# Patient Record
Sex: Male | Born: 1985 | Hispanic: Yes | Marital: Single | Smoking: Current some day smoker
Health system: Southern US, Community
[De-identification: ages and names within clinical notes are randomized; demographics above are authoritative.]

---

## 2016-06-10 ENCOUNTER — Emergency Department (HOSPITAL_COMMUNITY)
Admission: EM | Admit: 2016-06-10 | Discharge: 2016-06-10 | Disposition: A | Discharge status: Home / Self Care | Payer: Self-pay | Attending: Emergency Medicine | Admitting: Emergency Medicine

## 2016-06-10 ENCOUNTER — Encounter (HOSPITAL_COMMUNITY): Payer: Self-pay | Admitting: Emergency Medicine

## 2016-06-10 DIAGNOSIS — R234 Changes in skin texture: Secondary | ICD-10-CM | POA: Insufficient documentation

## 2016-06-10 DIAGNOSIS — R21 Rash and other nonspecific skin eruption: Secondary | ICD-10-CM

## 2016-06-10 DIAGNOSIS — L989 Disorder of the skin and subcutaneous tissue, unspecified: Secondary | ICD-10-CM | POA: Insufficient documentation

## 2016-06-10 DIAGNOSIS — M791 Myalgia: Secondary | ICD-10-CM | POA: Insufficient documentation

## 2016-06-10 DIAGNOSIS — F17210 Nicotine dependence, cigarettes, uncomplicated: Secondary | ICD-10-CM | POA: Insufficient documentation

## 2016-06-10 MED ORDER — DEXAMETHASONE 4 MG PO TABS: 4.0000 mg | ORAL_TABLET | Freq: Two times a day (BID) | ORAL | 0 refills | Status: AC

## 2016-06-10 MED ORDER — DIPHENHYDRAMINE HCL 25 MG PO TABS: 25.0000 mg | ORAL_TABLET | Freq: Four times a day (QID) | ORAL | 0 refills | Status: AC

## 2016-06-10 MED ORDER — PERMETHRIN 5 % EX CREA: TOPICAL_CREAM | CUTANEOUS | 1 refills | Status: AC

## 2016-06-10 MED ORDER — PREDNISONE 20 MG PO TABS
40.0000 mg | ORAL_TABLET | Freq: Once | ORAL | Status: AC
  Administered 2016-06-10: 40 mg via ORAL
  Filled 2016-06-10: qty 2

## 2016-06-10 MED ORDER — DOXYCYCLINE HYCLATE 100 MG PO TABS
100.0000 mg | ORAL_TABLET | Freq: Once | ORAL | Status: AC
  Administered 2016-06-10: 100 mg via ORAL
  Filled 2016-06-10: qty 1

## 2016-06-10 MED ORDER — LORATADINE 10 MG PO TABS
10.0000 mg | ORAL_TABLET | Freq: Once | ORAL | Status: AC
  Administered 2016-06-10: 10 mg via ORAL
  Filled 2016-06-10: qty 1

## 2016-06-10 MED ORDER — DOXYCYCLINE HYCLATE 100 MG PO CAPS: 100.0000 mg | ORAL_CAPSULE | Freq: Two times a day (BID) | ORAL | 0 refills | Status: AC

## 2016-06-10 NOTE — ED Triage Notes (Signed)
Pt reports rash on bilateral knees and entire body is starting to itch. Airway intact.

## 2016-06-10 NOTE — ED Provider Notes (Signed)
[facility]-EMERGENCY DEPT Provider Note   CSN: [id number] Arrival date & time: 06/10/16  1732  By signing my name below, I, Placido Sou, attest that this documentation has been prepared under the direction and in the presence of Ivery Quale, PA-C. Electronically Signed: Placido Sou, ED Scribe. 06/10/16. 6:36 PM.   History   Chief Complaint Chief Complaint  Patient presents with  . Rash    HPI HPI Comments: Lem Peary is a 30 y.o. male who presents to the Emergency Department complaining of worsening, mild, rash to his torso and extremities x 2 months. 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 His family whom he resides with has since began experiencing a similar rash. He additionally reports multiple healing scabs to his BLEs and originally had experienced mild swelling and drainage from the sites. No other associated symptoms at this time.   The history is provided by the patient. No language interpreter was used.  Rash   This is a new problem. The current episode started more than 1 week ago. The problem has been gradually worsening. There has been no fever. The rash is present on the left arm, right arm, left lower leg, right lower leg and abdomen. The pain is mild. Associated symptoms include itching and pain.    History reviewed. No pertinent past medical history.  There are no active problems to display for this patient.   History reviewed. No pertinent surgical history.   Home Medications    Prior to Admission medications   Not on File    Family History History reviewed. No pertinent family history.  Social History Social History  Substance Use Topics  . Smoking status: Current Every Day Smoker    Packs/day: 0.50    Types: Cigarettes  . Smokeless tobacco: Never Used  . Alcohol use No     Allergies   Review of  patient's allergies indicates no known allergies.   Review of Systems Review of Systems  Constitutional: Negative for fever.  Musculoskeletal: Positive for myalgias.  Skin: Positive for color change, itching, rash and wound.  All other systems reviewed and are negative.  Physical Exam Updated Vital Signs BP 132/77 (BP Location: Left Arm)   Pulse 90   Temp 99 F (37.2 C) (Oral)   Resp 18   Ht 5\' 6"  (1.676 m)   Wt 185 lb (83.9 kg)   SpO2 100%   BMI 29.86 kg/m   Physical Exam  Constitutional: He is oriented to person, place, and time. He appears well-developed and well-nourished.  HENT:  Head: Normocephalic and atraumatic.  Eyes: EOM are normal.  Neck: Normal range of motion.  Cardiovascular: Normal rate, regular rhythm, normal heart sounds and intact distal pulses.   Pulmonary/Chest: Effort normal and breath sounds normal. No respiratory distress.  Abdominal: Soft. He exhibits no distension. There is no tenderness.  Musculoskeletal: Normal range of motion.  Neurological: He is alert and oriented to person, place, and time.  Skin: Skin is warm and dry. Rash noted. There is erythema.  LLE has 2 scab wounds that are warm, raised, red and mildly tender. Resolved lesions on the RLE. Few raised bumps on the right lower arm. Lesions noted between the webbed spaces of the bilateral hands. Couple of red raised areas at the anterior belt line.   Psychiatric: He has a normal mood and affect. Judgment  normal.  Nursing note and vitals reviewed.   ED Treatments / Results  Labs (all labs ordered are listed, but only abnormal results are displayed) Labs Reviewed - No data to display  EKG  EKG Interpretation None       Radiology No results found.  Procedures Procedures  DIAGNOSTIC STUDIES: Oxygen Saturation is 100% on RA, normal by my interpretation.    COORDINATION OF CARE: 6:34 PM Discussed next steps with pt. Pt verbalized understanding and is agreeable with the plan.     Medications Ordered in ED Medications - No data to display   Initial Impression / Assessment and Plan / ED Course  I have reviewed the triage vital signs and the nursing notes.  Pertinent labs & imaging results that were available during my care of the patient were reviewed by me and considered in my medical decision making (see chart for details).  Clinical Course    *I have reviewed nursing notes, vital signs, and all appropriate lab and imaging results for this patient. ** **I personally performed the services described in this documentation, which was scribed in my presence. The recorded information has been reviewed and is accurate.* Final Clinical Impressions(s) / ED Diagnoses  Vital signs within normal limits. Pulse oximetry is 100% on room air. The examination favors a rash related to acute situation or anaphylaxis. Prescription for Decadron, Benadryl, doxycycline, and Elimite given to the patient. We discussed instructions. We also discussed the need to repeat the dosing of the Elimite if not improving in 5-7 days. Patient is in agreement with this discharge plan.    Final diagnoses:  Rash    New Prescriptions New Prescriptions   No medications on file     Ivery QualeHobson Ethyle Tiedt, PA-C 06/15/16 1345    Bethann BerkshireJoseph Zammit, MD 06/19/16 1513

## 2016-06-10 NOTE — Discharge Instructions (Addendum)
Please apply Elimite from head to toe. Leave it in place for 8 hours, then shower off. You may repeat this in one week if needed for your rash. Use Decadron 2 times daily. Use Benadryl every 6 hours as needed for itching. This medication may cause drowsiness. Do not drive, drink, or participate in activities requiring concentration when taking this medication. Use doxycycline 2 times daily until all taken, please take this medication with food.

## 2016-09-28 ENCOUNTER — Encounter (HOSPITAL_COMMUNITY): Payer: Self-pay | Admitting: Emergency Medicine

## 2016-09-28 ENCOUNTER — Emergency Department (HOSPITAL_COMMUNITY)
Admission: EM | Admit: 2016-09-28 | Discharge: 2016-09-28 | Disposition: A | Discharge status: Home / Self Care | Payer: Self-pay | Attending: Emergency Medicine | Admitting: Emergency Medicine

## 2016-09-28 DIAGNOSIS — Y999 Unspecified external cause status: Secondary | ICD-10-CM | POA: Insufficient documentation

## 2016-09-28 DIAGNOSIS — H7292 Unspecified perforation of tympanic membrane, left ear: Secondary | ICD-10-CM | POA: Insufficient documentation

## 2016-09-28 DIAGNOSIS — Y9389 Activity, other specified: Secondary | ICD-10-CM | POA: Insufficient documentation

## 2016-09-28 DIAGNOSIS — F17210 Nicotine dependence, cigarettes, uncomplicated: Secondary | ICD-10-CM | POA: Insufficient documentation

## 2016-09-28 DIAGNOSIS — Y929 Unspecified place or not applicable: Secondary | ICD-10-CM | POA: Insufficient documentation
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
## 2016-09-28 NOTE — Discharge Instructions (Signed)
You have been seen in the ED today with a ruptured eardrum. This will heal on its own. You will need to follow up with Dr. Annalee GentaShoemaker next week to decide if any surgery will be needed but most of these injuries heal on their own.   Return to the ED immediately with any fever, ear drainage, severe headache, or if you discover additional injuries.
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
## 2018-05-04 ENCOUNTER — Encounter (HOSPITAL_COMMUNITY): Payer: Self-pay | Admitting: Emergency Medicine

## 2018-05-04 ENCOUNTER — Other Ambulatory Visit: Payer: Self-pay

## 2018-05-04 ENCOUNTER — Emergency Department (HOSPITAL_COMMUNITY)
Admission: EM | Admit: 2018-05-04 | Discharge: 2018-05-04 | Disposition: A | Discharge status: Home / Self Care | Payer: Self-pay | Attending: Emergency Medicine | Admitting: Emergency Medicine

## 2018-05-04 ENCOUNTER — Emergency Department (HOSPITAL_COMMUNITY): Payer: Self-pay

## 2018-05-04 DIAGNOSIS — Y999 Unspecified external cause status: Secondary | ICD-10-CM | POA: Insufficient documentation

## 2018-05-04 DIAGNOSIS — Y939 Activity, unspecified: Secondary | ICD-10-CM | POA: Insufficient documentation

## 2018-05-04 DIAGNOSIS — X58XXXA Exposure to other specified factors, initial encounter: Secondary | ICD-10-CM | POA: Insufficient documentation

## 2018-05-04 DIAGNOSIS — Z79899 Other long term (current) drug therapy: Secondary | ICD-10-CM | POA: Insufficient documentation

## 2018-05-04 DIAGNOSIS — Z87891 Personal history of nicotine dependence: Secondary | ICD-10-CM | POA: Insufficient documentation

## 2018-05-04 DIAGNOSIS — Y929 Unspecified place or not applicable: Secondary | ICD-10-CM | POA: Insufficient documentation

## 2018-05-04 DIAGNOSIS — S46912A Strain of unspecified muscle, fascia and tendon at shoulder and upper arm level, left arm, initial encounter: Secondary | ICD-10-CM | POA: Insufficient documentation

## 2018-05-04 MED ORDER — IBUPROFEN 800 MG PO TABS
800.0000 mg | ORAL_TABLET | Freq: Once | ORAL | Status: AC
  Administered 2018-05-04: 800 mg via ORAL
  Filled 2018-05-04: qty 1

## 2018-05-04 MED ORDER — IBUPROFEN 800 MG PO TABS: 800.0000 mg | ORAL_TABLET | Freq: Three times a day (TID) | ORAL | 0 refills | Status: AC

## 2018-05-04 NOTE — ED Provider Notes (Signed)
[facility] EMERGENCY DEPARTMENT Provider Note   CSN: [id number] Arrival date & time: 05/04/18  1849     History   Chief Complaint Chief Complaint  Patient presents with  . Shoulder Pain    HPI Andrew Zimmerman is a 32 y.o. male.  The history is provided by the patient.  Shoulder Pain   This is a new problem. The current episode started 2 days ago. The problem occurs constantly. The problem has not changed since onset.The pain is present in the left shoulder. The quality of the pain is described as intermittent and aching. The pain is at a severity of 7/10. The pain is moderate. Pertinent negatives include no numbness, full range of motion, no stiffness and no tingling. Associated symptoms comments: Lying directly on the shoulder. He has tried nothing for the symptoms. There has been no history of extremity trauma (He denies direct injury but states he was running down a flight of steps when he started slipping so jumped from the 3rd to bottom. landed on feet but felt a pain in the left shoulder on impact.).    History reviewed. No pertinent past medical history.  There are no active problems to display for this patient.   History reviewed. No pertinent surgical history.      Home Medications    Prior to Admission medications   Medication Sig Start Date End Date Taking? Authorizing Provider  dexamethasone (DECADRON) 4 MG tablet Take 1 tablet (4 mg total) by mouth 2 (two) times daily with a meal. 06/10/16   Ivery Quale, PA-C  diphenhydrAMINE (BENADRYL) 25 MG tablet Take 1 tablet (25 mg total) by mouth every 6 (six) hours. 06/10/16   Ivery Quale, PA-C  doxycycline (VIBRAMYCIN) 100 MG capsule Take 1 capsule (100 mg total) by mouth 2 (two) times daily. 06/10/16   Ivery Quale, PA-C  ibuprofen (ADVIL,MOTRIN) 800 MG tablet Take 1 tablet (800 mg total) by mouth 3 (three) times daily. 05/04/18   Burgess Amor, PA-C  permethrin (ELIMITE) 5 % cream Apply cream from head to feet. Leave on  for 8 hours and shower off. May repeat in 1 week if needed. 06/10/16   Ivery Quale, PA-C    Family History No family history on file.  Social History Social History   Tobacco Use  . Smoking status: Former Smoker    Packs/day: 0.50    Types: Cigarettes  . Smokeless tobacco: Current User    Types: Snuff  Substance Use Topics  . Alcohol use: Yes  . Drug use: No     Allergies   Patient has no known allergies.   Review of Systems Review of Systems  Constitutional: Negative for fever.  Musculoskeletal: Positive for arthralgias. Negative for joint swelling, myalgias and stiffness.  Neurological: Negative for tingling, weakness and numbness.     Physical Exam Updated Vital Signs BP 129/72   Pulse 84   Temp 98.1 F (36.7 C)   Resp 18   Ht 5\' 8"  (1.727 m)   Wt 81.6 kg (180 lb)   SpO2 100%   BMI 27.37 kg/m   Physical Exam  Constitutional: He appears well-developed and well-nourished.  HENT:  Head: Atraumatic.  Neck: Normal range of motion.  Cardiovascular:  Pulses equal bilaterally  Musculoskeletal: He exhibits tenderness.       Left shoulder: He exhibits tenderness. He exhibits normal range of motion, no bony tenderness, no swelling, no effusion, no deformity, normal pulse and normal strength.  ttp left posterior shoulder joint space  and across the trapezius. No deformity, no crepitus of the shoulder joint with ROM.  There is no muscle spasm appreciated.  Neurological: He is alert. He has normal strength. He displays normal reflexes. No sensory deficit.  Skin: Skin is warm and dry.  Psychiatric: He has a normal mood and affect.  Nursing note and vitals reviewed.    ED Treatments / Results  Labs (all labs ordered are listed, but only abnormal results are displayed) Labs Reviewed - No data to display  EKG None  Radiology Dg Shoulder Left  Result Date: 05/04/2018 CLINICAL DATA:  Left shoulder pain times 19 hours. EXAM: LEFT SHOULDER - 2+ VIEW COMPARISON:   None. FINDINGS: There is no evidence of fracture or dislocation. There is no evidence of arthropathy or other focal bone abnormality. Soft tissues are unremarkable. IMPRESSION: Negative. Electronically Signed   By: Tollie Ethavid  Kwon M.D.   On: 05/04/2018 19:50    Procedures Procedures (including critical care time)  Medications Ordered in ED Medications  ibuprofen (ADVIL,MOTRIN) tablet 800 mg (800 mg Oral Given 05/04/18 2107)     Initial Impression / Assessment and Plan / ED Course  I have reviewed the triage vital signs and the nursing notes.  Pertinent labs & imaging results that were available during my care of the patient were reviewed by me and considered in my medical decision making (see chart for details).     Imaging reviewed and discussed with pt.  Rest, ice, ibuprofen, advised prn f/u with no improvement over the next week.   Final Clinical Impressions(s) / ED Diagnoses   Final diagnoses:  Strain of left shoulder, initial encounter    ED Discharge Orders        Ordered    ibuprofen (ADVIL,MOTRIN) 800 MG tablet  3 times daily     05/04/18 2058       Burgess Amordol, Erle Guster, PA-C 05/04/18 2217    Mesner, Barbara CowerJason, MD 05/04/18 [phone]

## 2018-05-04 NOTE — Discharge Instructions (Addendum)
I suspect he may have strained a muscle in your left shoulder.  Your x-rays and exam are normal tonight.  Use the ibuprofen as prescribed.  Additionally I recommend application of heat for 20 minutes several times daily.  You may also use your icy hot muscle rub but do not apply the heating pad on top of the muscle rub as this can cause skin irritation.  Expect gradual resolution of your pain over the next week, get rechecked if your symptoms do not improve with this treatment.

## 2018-05-04 NOTE — ED Triage Notes (Signed)
Pt c/o left shoulder pain since Saturday. Pt can raise his left arm.

## 2018-08-11 ENCOUNTER — Encounter (HOSPITAL_COMMUNITY): Payer: Self-pay | Admitting: Emergency Medicine

## 2018-08-11 ENCOUNTER — Other Ambulatory Visit: Payer: Self-pay

## 2018-08-11 ENCOUNTER — Emergency Department (HOSPITAL_COMMUNITY)
Admission: EM | Admit: 2018-08-11 | Discharge: 2018-08-11 | Disposition: A | Discharge status: Home / Self Care | Payer: Self-pay | Attending: Emergency Medicine | Admitting: Emergency Medicine

## 2018-08-11 DIAGNOSIS — H1031 Unspecified acute conjunctivitis, right eye: Secondary | ICD-10-CM | POA: Insufficient documentation

## 2018-08-11 DIAGNOSIS — F17210 Nicotine dependence, cigarettes, uncomplicated: Secondary | ICD-10-CM | POA: Insufficient documentation

## 2018-08-11 DIAGNOSIS — Z79899 Other long term (current) drug therapy: Secondary | ICD-10-CM | POA: Insufficient documentation

## 2018-08-11 MED ORDER — HYDROCODONE-ACETAMINOPHEN 5-325 MG PO TABS
2.0000 | ORAL_TABLET | Freq: Once | ORAL | Status: AC
  Administered 2018-08-11: 2 via ORAL
  Filled 2018-08-11: qty 2

## 2018-08-11 MED ORDER — TOBRAMYCIN 0.3 % OP SOLN
2.0000 [drp] | OPHTHALMIC | 0 refills | Status: AC
Start: 2018-08-11 — End: ?

## 2018-08-11 NOTE — Discharge Instructions (Signed)
Return if any problems.

## 2018-08-11 NOTE — ED Provider Notes (Signed)
[facility] EMERGENCY DEPARTMENT Provider Note   CSN: [id number] Arrival date & time: 08/11/18  1409     History   Chief Complaint Chief Complaint  Patient presents with  . Eye Problem    HPI Andrew Zimmerman is a 32 y.o. male.  The history is provided by the patient. No language interpreter was used.  Eye Problem   This is a new problem. The problem occurs constantly. The problem has been gradually worsening. There is a problem in the right eye. There was no injury mechanism. The pain is moderate. There is no history of trauma to the eye. There is no known exposure to pink eye. He does not wear contacts. Associated symptoms include decreased vision. He has tried nothing for the symptoms. The treatment provided no relief.  Pt complains of right eye redness. Pt reports thick drainage.   History reviewed. No pertinent past medical history.  There are no active problems to display for this patient.   History reviewed. No pertinent surgical history.      Home Medications    Prior to Admission medications   Medication Sig Start Date End Date Taking? Authorizing Provider  dexamethasone (DECADRON) 4 MG tablet Take 1 tablet (4 mg total) by mouth 2 (two) times daily with a meal. 06/10/16   Ivery Quale, PA-C  diphenhydrAMINE (BENADRYL) 25 MG tablet Take 1 tablet (25 mg total) by mouth every 6 (six) hours. 06/10/16   Ivery Quale, PA-C  doxycycline (VIBRAMYCIN) 100 MG capsule Take 1 capsule (100 mg total) by mouth 2 (two) times daily. 06/10/16   Ivery Quale, PA-C  ibuprofen (ADVIL,MOTRIN) 800 MG tablet Take 1 tablet (800 mg total) by mouth 3 (three) times daily. 05/04/18   Burgess Amor, PA-C  permethrin (ELIMITE) 5 % cream Apply cream from head to feet. Leave on for 8 hours and shower off. May repeat in 1 week if needed. 06/10/16   Ivery Quale, PA-C  tobramycin (TOBREX) 0.3 % ophthalmic solution Place 2 drops into the right eye every 4 (four) hours. 08/11/18   Elson Areas, PA-C     Family History History reviewed. No pertinent family history.  Social History Social History   Tobacco Use  . Smoking status: Current Some Day Smoker    Packs/day: 0.50    Types: Cigarettes  . Smokeless tobacco: Current User    Types: Snuff  Substance Use Topics  . Alcohol use: Yes    Comment: occasionally  . Drug use: No     Allergies   Patient has no known allergies.   Review of Systems Review of Systems  All other systems reviewed and are negative.    Physical Exam Updated Vital Signs BP 125/68 (BP Location: Right Arm)   Pulse 76   Temp 97.8 F (36.6 C) (Oral)   Resp 14   Ht 5\' 8"  (1.727 m)   Wt 77.1 kg   SpO2 99%   BMI 25.85 kg/m   Physical Exam  Constitutional: He appears well-developed and well-nourished.  HENT:  Head: Normocephalic and atraumatic.  Right Ear: External ear normal.  Left Ear: External ear normal.  Nose: Nose normal.  Mouth/Throat: Oropharynx is clear and moist.  Eyes: Conjunctivae are normal.  Neck: Neck supple.  Cardiovascular: Normal rate and regular rhythm.  No murmur heard. Pulmonary/Chest: Effort normal and breath sounds normal. No respiratory distress.  Abdominal: Soft. There is no tenderness.  Musculoskeletal: Normal range of motion. He exhibits no edema.  Neurological: He is alert.  Skin:  Skin is warm and dry.  Psychiatric: He has a normal mood and affect.  Nursing note and vitals reviewed.    ED Treatments / Results  Labs (all labs ordered are listed, but only abnormal results are displayed) Labs Reviewed - No data to display  EKG None  Radiology No results found.  Procedures Procedures (including critical care time)  Medications Ordered in ED Medications  HYDROcodone-acetaminophen (NORCO/VICODIN) 5-325 MG per tablet 2 tablet (2 tablets Oral Given 08/11/18 1603)     Initial Impression / Assessment and Plan / ED Course  I have reviewed the triage vital signs and the nursing notes.  Pertinent  labs & imaging results that were available during my care of the patient were reviewed by me and considered in my medical decision making (see chart for details).     Pt counseled on infection.  Pt given hydrocodone 2 here.  Rx for tobrex.   Final Clinical Impressions(s) / ED Diagnoses   Final diagnoses:  Acute conjunctivitis of right eye, unspecified acute conjunctivitis type    ED Discharge Orders         Ordered    tobramycin (TOBREX) 0.3 % ophthalmic solution  Every 4 hours     08/11/18 1601        An After Visit Summary was printed and given to the patient.    Elson AreasSofia, Janvi Ammar K, [place]PA-C 08/11/18 1617    Bethann BerkshireZammit, Joseph, MD 08/12/18 [phone]

## 2018-08-11 NOTE — ED Triage Notes (Signed)
Patient complaining of right eye burning, redness, and drainage x 3 days.

## 2018-08-15 ENCOUNTER — Encounter (HOSPITAL_COMMUNITY): Payer: Self-pay

## 2018-08-15 ENCOUNTER — Other Ambulatory Visit: Payer: Self-pay

## 2018-08-15 ENCOUNTER — Emergency Department (HOSPITAL_COMMUNITY)
Admission: EM | Admit: 2018-08-15 | Discharge: 2018-08-15 | Disposition: A | Discharge status: Home / Self Care | Payer: Self-pay | Attending: Emergency Medicine | Admitting: Emergency Medicine

## 2018-08-15 DIAGNOSIS — F17210 Nicotine dependence, cigarettes, uncomplicated: Secondary | ICD-10-CM | POA: Insufficient documentation

## 2018-08-15 DIAGNOSIS — H1031 Unspecified acute conjunctivitis, right eye: Secondary | ICD-10-CM

## 2018-08-15 DIAGNOSIS — H10021 Other mucopurulent conjunctivitis, right eye: Secondary | ICD-10-CM | POA: Insufficient documentation

## 2018-08-15 MED ORDER — FLUORESCEIN SODIUM 1 MG OP STRP
1.0000 | ORAL_STRIP | Freq: Once | OPHTHALMIC | Status: AC
  Administered 2018-08-15: 1 via OPHTHALMIC
  Filled 2018-08-15: qty 1

## 2018-08-15 MED ORDER — GENTAMICIN SULFATE 0.3 % OP SOLN: 2.0000 [drp] | OPHTHALMIC | 0 refills | Status: AC

## 2018-08-15 MED ORDER — TETRACAINE HCL 0.5 % OP SOLN
2.0000 [drp] | Freq: Once | OPHTHALMIC | Status: AC
  Administered 2018-08-15: 2 [drp] via OPHTHALMIC
  Filled 2018-08-15: qty 4

## 2018-08-15 MED ORDER — TOBRAMYCIN 0.3 % OP OINT
TOPICAL_OINTMENT | Freq: Once | OPHTHALMIC | Status: AC
  Administered 2018-08-15: 22:00:00 via OPHTHALMIC
  Filled 2018-08-15: qty 3.5

## 2018-08-15 NOTE — ED Triage Notes (Signed)
Pt here for re evaluation of right eye redness, itching and drainage x1 week. Pt was given eye drops without relief of symptoms.

## 2018-08-15 NOTE — Discharge Instructions (Signed)
Not wear your contacts until infection has completely cleared. Plan eyedrops every 4 hours while awake. Follow-up with eye doctor on Monday.

## 2018-08-15 NOTE — ED Provider Notes (Signed)
[facility] EMERGENCY DEPARTMENT Provider Note   CSN: [id number] Arrival date & time: 08/15/18  2110     History   Chief Complaint Chief Complaint  Patient presents with  . Eye Problem    HPI Andrew Zimmerman is a 32 y.o. male.  32 year old male presents with complaint of ongoing pinkeye affecting his right eye.  Patient wears contact lenses, was seen at [facility], ER 1 week ago and given tobramycin eyedrops to apply.  Patient has continued to wear his contacts and sleep and his contact lenses.  Reports green drainage from his eye.  Denies pain in the eye reports foreign body sensation.  No other complaints or concerns.     History reviewed. No pertinent past medical history.  There are no active problems to display for this patient.   History reviewed. No pertinent surgical history.      Home Medications    Prior to Admission medications   Medication Sig Start Date End Date Taking? Authorizing Provider  dexamethasone (DECADRON) 4 MG tablet Take 1 tablet (4 mg total) by mouth 2 (two) times daily with a meal. 06/10/16   Ivery Quale, PA-C  diphenhydrAMINE (BENADRYL) 25 MG tablet Take 1 tablet (25 mg total) by mouth every 6 (six) hours. 06/10/16   Ivery Quale, PA-C  doxycycline (VIBRAMYCIN) 100 MG capsule Take 1 capsule (100 mg total) by mouth 2 (two) times daily. 06/10/16   Ivery Quale, PA-C  gentamicin (GARAMYCIN) 0.3 % ophthalmic solution Place 2 drops into the right eye every 4 (four) hours. 08/15/18   Jeannie Fend, PA-C  ibuprofen (ADVIL,MOTRIN) 800 MG tablet Take 1 tablet (800 mg total) by mouth 3 (three) times daily. 05/04/18   Burgess Amor, PA-C  permethrin (ELIMITE) 5 % cream Apply cream from head to feet. Leave on for 8 hours and shower off. May repeat in 1 week if needed. 06/10/16   Ivery Quale, PA-C  tobramycin (TOBREX) 0.3 % ophthalmic solution Place 2 drops into the right eye every 4 (four) hours. 08/11/18   Elson Areas, PA-C     Family History No family history on file.  Social History Social History   Tobacco Use  . Smoking status: Current Some Day Smoker    Packs/day: 0.50    Types: Cigarettes  . Smokeless tobacco: Current User    Types: Snuff  Substance Use Topics  . Alcohol use: Yes    Comment: occasionally  . Drug use: No     Allergies   Patient has no known allergies.   Review of Systems Review of Systems  Constitutional: Negative for fever.  HENT: Negative for congestion and sore throat.   Eyes: Positive for discharge and redness. Negative for photophobia, pain and visual disturbance.  Skin: Negative for rash and wound.  Allergic/Immunologic: Negative for immunocompromised state.  Hematological: Negative for adenopathy. Does not bruise/bleed easily.  Psychiatric/Behavioral: Negative for confusion.  All other systems reviewed and are negative.    Physical Exam Updated Vital Signs BP 118/79 (BP Location: Right Arm)   Pulse 92   Temp 97.9 F (36.6 C)   Resp 18   Ht 5\' 8"  (1.727 m)   Wt 77.1 kg   SpO2 100%   BMI 25.84 kg/m   Physical Exam  Constitutional: He is oriented to person, place, and time. He appears well-developed and well-nourished. No distress.  HENT:  Head: Normocephalic and atraumatic.  Eyes: Pupils are equal, round, and reactive to light. Lids are normal. Right eye  exhibits no discharge. Right conjunctiva is injected. Right conjunctiva has no hemorrhage. Left conjunctiva is not injected. Left conjunctiva has no hemorrhage. Right eye exhibits normal extraocular motion. Left eye exhibits normal extraocular motion.  Slit lamp exam:      The right eye shows no corneal abrasion, no corneal flare, no corneal ulcer and no fluorescein uptake.  Right eye exam and fluorescein stain and Woods lamp, no fluorescein uptake, no streaming.  Pulmonary/Chest: Effort normal.  Neurological: He is alert and oriented to person, place, and time.  Skin: He is not diaphoretic.   Psychiatric: He has a normal mood and affect. His behavior is normal.  Nursing note and vitals reviewed.    ED Treatments / Results  Labs (all labs ordered are listed, but only abnormal results are displayed) Labs Reviewed - No data to display  EKG None  Radiology No results found.  Procedures Procedures (including critical care time)  Medications Ordered in ED Medications  fluorescein ophthalmic strip 1 strip (1 strip Right Eye Given 08/15/18 2139)  tetracaine (PONTOCAINE) 0.5 % ophthalmic solution 2 drop (2 drops Right Eye Given 08/15/18 2139)  tobramycin (TOBREX) 0.3 % ophthalmic ointment ( Right Eye Given 08/15/18 2210)     Initial Impression / Assessment and Plan / ED Course  I have reviewed the triage vital signs and the nursing notes.  Pertinent labs & imaging results that were available during my care of the patient were reviewed by me and considered in my medical decision making (see chart for details).  Clinical Course as of Aug 15 2224  Sat Aug 15, 2018  [id number] 32 year old male presents to the ER with complaint of ongoing pinkeye.  Patient has been applying eyedrops as prescribed by the ER 1 week ago.  Patient continues to wear his contact lenses and sleep and his contact lenses.  Patient denies pain in his eye.  Reports green drainage from his eye at times.  Patient was advised to remove his contact lens and throw it away, do not wear contact lenses again until infection is completely resolved.  On fluorescein exam patient does not have evidence of a corneal ulcer.  Given prescription for gentamicin drops, tobramycin applied to the eye while in the ER.  Advised to follow-up with ophthalmology on Monday.  Patient plans to see optometry tomorrow for glasses fitting.   [LM]    Clinical Course User Index [LM] Jeannie FendMurphy, Roxy Mastandrea A, PA-C   Final Clinical Impressions(s) / ED Diagnoses   Final diagnoses:  Acute bacterial conjunctivitis of right eye    ED Discharge Orders          Ordered    gentamicin (GARAMYCIN) 0.3 % ophthalmic solution  Every 4 hours     08/15/18 2205           Jeannie FendMurphy, Jesper Stirewalt A, PA-C 08/15/18 2225    Maia PlanLong, Joshua G, MD 08/16/18 [id number]

## 2018-11-27 ENCOUNTER — Emergency Department (HOSPITAL_COMMUNITY): Payer: Self-pay

## 2018-11-27 ENCOUNTER — Encounter (HOSPITAL_COMMUNITY): Payer: Self-pay

## 2018-11-27 ENCOUNTER — Emergency Department (HOSPITAL_COMMUNITY)
Admission: EM | Admit: 2018-11-27 | Discharge: 2018-11-27 | Disposition: A | Discharge status: Home / Self Care | Payer: Self-pay | Attending: Emergency Medicine | Admitting: Emergency Medicine

## 2018-11-27 ENCOUNTER — Other Ambulatory Visit: Payer: Self-pay

## 2018-11-27 DIAGNOSIS — Z23 Encounter for immunization: Secondary | ICD-10-CM | POA: Insufficient documentation

## 2018-11-27 DIAGNOSIS — S022XXA Fracture of nasal bones, initial encounter for closed fracture: Secondary | ICD-10-CM | POA: Insufficient documentation

## 2018-11-27 DIAGNOSIS — S0230XA Fracture of orbital floor, unspecified side, initial encounter for closed fracture: Secondary | ICD-10-CM | POA: Insufficient documentation

## 2018-11-27 DIAGNOSIS — Y939 Activity, unspecified: Secondary | ICD-10-CM | POA: Insufficient documentation

## 2018-11-27 DIAGNOSIS — Y999 Unspecified external cause status: Secondary | ICD-10-CM | POA: Insufficient documentation

## 2018-11-27 DIAGNOSIS — F17210 Nicotine dependence, cigarettes, uncomplicated: Secondary | ICD-10-CM | POA: Insufficient documentation

## 2018-11-27 DIAGNOSIS — Y929 Unspecified place or not applicable: Secondary | ICD-10-CM | POA: Insufficient documentation

## 2018-11-27 DIAGNOSIS — Z79899 Other long term (current) drug therapy: Secondary | ICD-10-CM | POA: Insufficient documentation

## 2018-11-27 MED ORDER — CEPHALEXIN 500 MG PO CAPS: 500.0000 mg | ORAL_CAPSULE | Freq: Four times a day (QID) | ORAL | 0 refills | Status: DC

## 2018-11-27 MED ORDER — TETANUS-DIPHTH-ACELL PERTUSSIS 5-2.5-18.5 LF-MCG/0.5 IM SUSP
0.5000 mL | Freq: Once | INTRAMUSCULAR | Status: AC
  Administered 2018-11-27: 0.5 mL via INTRAMUSCULAR
  Filled 2018-11-27: qty 0.5

## 2018-11-27 NOTE — ED Notes (Addendum)
Dermabond at bedside.  

## 2018-11-27 NOTE — ED Notes (Signed)
Patients wounds cleaned

## 2018-11-27 NOTE — ED Notes (Signed)
Patient sleeping at this time.
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
## 2018-11-27 NOTE — ED Notes (Signed)
Patient given oral fluids. Unable to ambulate patient at this time.
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
## 2018-11-27 NOTE — ED Notes (Signed)
Pt ambulated to and from bathroom without assistance, no complaints at this time

## 2018-11-27 NOTE — ED Notes (Signed)
Patient transported to CT 
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
## 2018-11-27 NOTE — ED Notes (Signed)
Patient to unsteady to ambulate at this time.

## 2018-11-27 NOTE — ED Notes (Signed)
Patient transported to X-ray 

## 2018-11-27 NOTE — ED Notes (Signed)
Patient awake at this time drinking fluids. Patient still a little disoriented. Advised patient to drink fluids and that we will ambulate him shortly.

## 2019-04-22 ENCOUNTER — Emergency Department (HOSPITAL_COMMUNITY)
Admission: EM | Admit: 2019-04-22 | Discharge: 2019-04-22 | Disposition: A | Discharge status: Home / Self Care | Payer: Self-pay | Attending: Emergency Medicine | Admitting: Emergency Medicine

## 2019-04-22 ENCOUNTER — Encounter (HOSPITAL_COMMUNITY): Payer: Self-pay | Admitting: Emergency Medicine

## 2019-04-22 ENCOUNTER — Other Ambulatory Visit: Payer: Self-pay

## 2019-04-22 ENCOUNTER — Emergency Department (HOSPITAL_COMMUNITY): Payer: Self-pay

## 2019-04-22 DIAGNOSIS — Y9289 Other specified places as the place of occurrence of the external cause: Secondary | ICD-10-CM | POA: Insufficient documentation

## 2019-04-22 DIAGNOSIS — S61112A Laceration without foreign body of left thumb with damage to nail, initial encounter: Secondary | ICD-10-CM | POA: Insufficient documentation

## 2019-04-22 DIAGNOSIS — Y9389 Activity, other specified: Secondary | ICD-10-CM | POA: Insufficient documentation

## 2019-04-22 DIAGNOSIS — W260XXA Contact with knife, initial encounter: Secondary | ICD-10-CM | POA: Insufficient documentation

## 2019-04-22 DIAGNOSIS — Y990 Civilian activity done for income or pay: Secondary | ICD-10-CM | POA: Insufficient documentation

## 2019-04-22 MED ORDER — CEPHALEXIN 250 MG PO CAPS
250.0000 mg | ORAL_CAPSULE | Freq: Once | ORAL | Status: AC
  Administered 2019-04-22: 250 mg via ORAL
  Filled 2019-04-22: qty 1

## 2019-04-22 MED ORDER — CEPHALEXIN 250 MG PO CAPS: 250.0000 mg | ORAL_CAPSULE | Freq: Four times a day (QID) | ORAL | 0 refills | Status: AC

## 2019-04-22 NOTE — ED Notes (Addendum)
Laceration to left thumb a couple of days ago with tile saw band. No bleeding at this time.

## 2019-04-22 NOTE — ED Provider Notes (Signed)
[facility]   CSN: [id number] Arrival date & time: 04/22/19  1927    History   Chief Complaint Chief Complaint  Patient presents with  . Finger Injury    HPI Andrew Zimmerman is a 33 y.o. male presenting for evaluation of thumb injury.  Patient states 5 days ago he was using a knife for work when it slipped and he cut his left thumb.  Patient states he is here today because he wanted to get it checked to make sure there would be repercussions or loss of function for his home.  He denies pain.  He has not been taking anything for it.  His tetanus is up-to-date.  He is not on blood thinners. He denies numbness or tingling.     HPI  History reviewed. No pertinent past medical history.  There are no active problems to display for this patient.   History reviewed. No pertinent surgical history.      Home Medications    Prior to Admission medications   Medication Sig Start Date End Date Taking? Authorizing Provider  cephALEXin (KEFLEX) 250 MG capsule Take 1 capsule (250 mg total) by mouth 4 (four) times daily for 5 days. 04/22/19 04/27/19  Latoy Labriola, PA-C  dexamethasone (DECADRON) 4 MG tablet Take 1 tablet (4 mg total) by mouth 2 (two) times daily with a meal. 06/10/16   Ivery QualeBryant, Hobson, PA-C  diphenhydrAMINE (BENADRYL) 25 MG tablet Take 1 tablet (25 mg total) by mouth every 6 (six) hours. 06/10/16   Ivery QualeBryant, Hobson, PA-C  doxycycline (VIBRAMYCIN) 100 MG capsule Take 1 capsule (100 mg total) by mouth 2 (two) times daily. 06/10/16   Ivery QualeBryant, Hobson, PA-C  gentamicin (GARAMYCIN) 0.3 % ophthalmic solution Place 2 drops into the right eye every 4 (four) hours. 08/15/18   Jeannie FendMurphy, Laura A, PA-C  ibuprofen (ADVIL,MOTRIN) 800 MG tablet Take 1 tablet (800 mg total) by mouth 3 (three) times daily. 05/04/18   Burgess AmorIdol, Julie, PA-C  permethrin (ELIMITE) 5 % cream Apply cream from head to feet. Leave on for 8 hours and shower off. May repeat in 1  week if needed. 06/10/16   Ivery QualeBryant, Hobson, PA-C  tobramycin (TOBREX) 0.3 % ophthalmic solution Place 2 drops into the right eye every 4 (four) hours. 08/11/18   Elson AreasSofia, Leslie K, PA-C    Family History History reviewed. No pertinent family history.  Social History Social History   Tobacco Use  . Smoking status: Current Some Day Smoker    Packs/day: 0.50    Types: Cigarettes  . Smokeless tobacco: Current User    Types: Snuff  Substance Use Topics  . Alcohol use: Yes    Comment: occasionally  . Drug use: No     Allergies   Patient has no known allergies.   Review of Systems Review of Systems  Skin: Positive for wound.  Hematological: Does not bruise/bleed easily.     Physical Exam Updated Vital Signs BP 139/71   Pulse 70   Temp 98.3 F (36.8 C) (Oral)   Resp 17   Ht 5\' 8"  (1.727 m)   Wt 79.4 kg   SpO2 100%   BMI 26.61 kg/m   Physical Exam Vitals signs and nursing note reviewed.  Constitutional:      General: He is not in acute distress.    Appearance: He is well-developed.     Comments: Appears nontoxic  HENT:     Head: Normocephalic and atraumatic.  Neck:  Musculoskeletal: Normal range of motion.  Pulmonary:     Effort: Pulmonary effort is normal.  Abdominal:     General: There is no distension.  Musculoskeletal: Normal range of motion.  Skin:    General: Skin is warm.     Capillary Refill: Capillary refill takes less than 2 seconds.     Findings: No rash.     Comments: See picture below.  Laceration of the radial aspect of the distal left thumb.  Small flap with nail attached.  No injury noted elsewhere in the thumb.  Full active range of motion of the thumb without difficulty.  Sensation intact.  No active bleeding.  No significant swelling, erythema, streaking, or signs of infection.  Neurological:     Mental Status: He is alert and oriented to person, place, and time.            ED Treatments / Results  Labs (all labs ordered are  listed, but only abnormal results are displayed) Labs Reviewed - No data to display  EKG None  Radiology Dg Finger Thumb Left  Result Date: 04/22/2019 CLINICAL DATA:  Injury. Distal left thumb laceration. Injury with knife. EXAM: LEFT THUMB 2+V COMPARISON:  None. FINDINGS: There is no evidence of fracture or dislocation. There is no evidence of arthropathy or other focal bone abnormality. Soft tissue injury through the distal digit/nail bed. Punctate density may represent tiny foreign body. IMPRESSION: Soft tissue injury to the distal digit bed. Punctate density may represent tiny foreign body. No osseous abnormality. Electronically Signed   By: Keith Rake M.D.   On: 04/22/2019 20:18    Procedures Procedures (including critical care time)  Medications Ordered in ED Medications  cephALEXin (KEFLEX) capsule 250 mg (250 mg Oral Given 04/22/19 2207)     Initial Impression / Assessment and Plan / ED Course  I have reviewed the triage vital signs and the nursing notes.  Pertinent labs & imaging results that were available during my care of the patient were reviewed by me and considered in my medical decision making (see chart for details).        Patient presenting for evaluation of finger injury.  Physical examination, he is neurovascularly intact.  Tetanus is up-to-date.  As injury is 5 days out, will not repair with sutures due to risk for infection.  X-ray viewed interpreted by me, no bony abnormality.  Per radiology read, possible foreign body, however on inspection this is external and on the skin.  Discussed importance of cleaning the wound with patient. Thumb cleaned and dressing applied.  Will place on antibiotics for prophylaxis as he has a open wound.  Encourage monitoring for signs of infection.  At this time, patient appears safe for discharge.  Return precautions given.  Patient states he understands and agrees plan.  Final Clinical Impressions(s) / ED Diagnoses   Final  diagnoses:  Laceration of left thumb without foreign body with damage to nail, initial encounter    ED Discharge Orders         Ordered    cephALEXin (KEFLEX) 250 MG capsule  4 times daily     04/22/19 2203           Franchot Heidelberg, PA-C 04/22/19 2214    Charlesetta Shanks, MD 04/27/19 1001

## 2019-04-22 NOTE — ED Notes (Signed)
Discharge instructions discussed with pt. Pt verbalized understanding, pt no questions at this time.

## 2019-04-22 NOTE — Discharge Instructions (Signed)
Take antibiotics as prescribed. Take the entire course, even if your symptoms improve.  Keep a dressing over the wound to prevent infection and pulling at the cut. Wash daily with soap and water.  If you can, soak your hand in soapy water for 20 minutes at a time 3 times a day for the next week. Monitor for signs of infection.  Return to the emergency room if you develop fevers, pus from the area, any worsening pain, new, worsening, concerning symptoms.

## 2019-04-22 NOTE — ED Triage Notes (Signed)
Pt reports he cut his L thumb with a utility knife 5 days ago. Pt has laceration to L thumb, part of the fingernail is cut.

## 2019-07-21 IMAGING — DX DG SHOULDER 2+V*L*
3 series · 3 of 3 positions shown · non-contrast
Comparison: None.

CLINICAL DATA: Left shoulder pain times 19 hours.

EXAM:
LEFT SHOULDER - 2+ VIEW

[shoulder grashey]
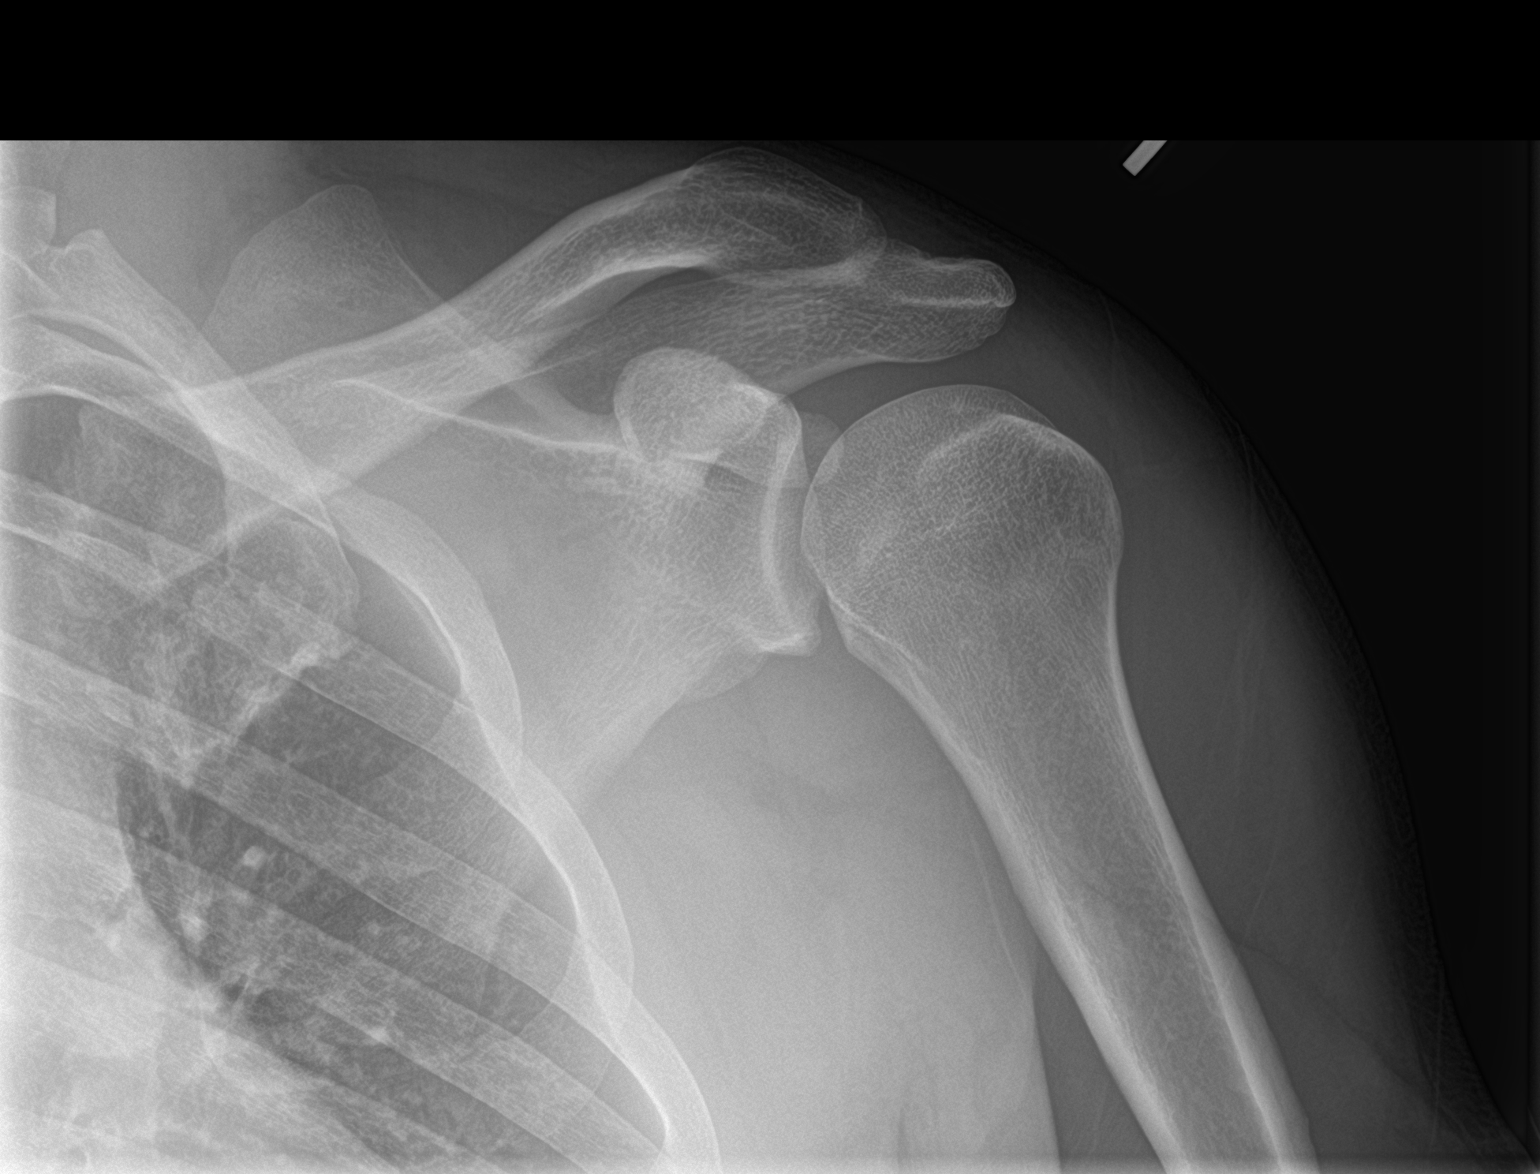

[shoulder y view]
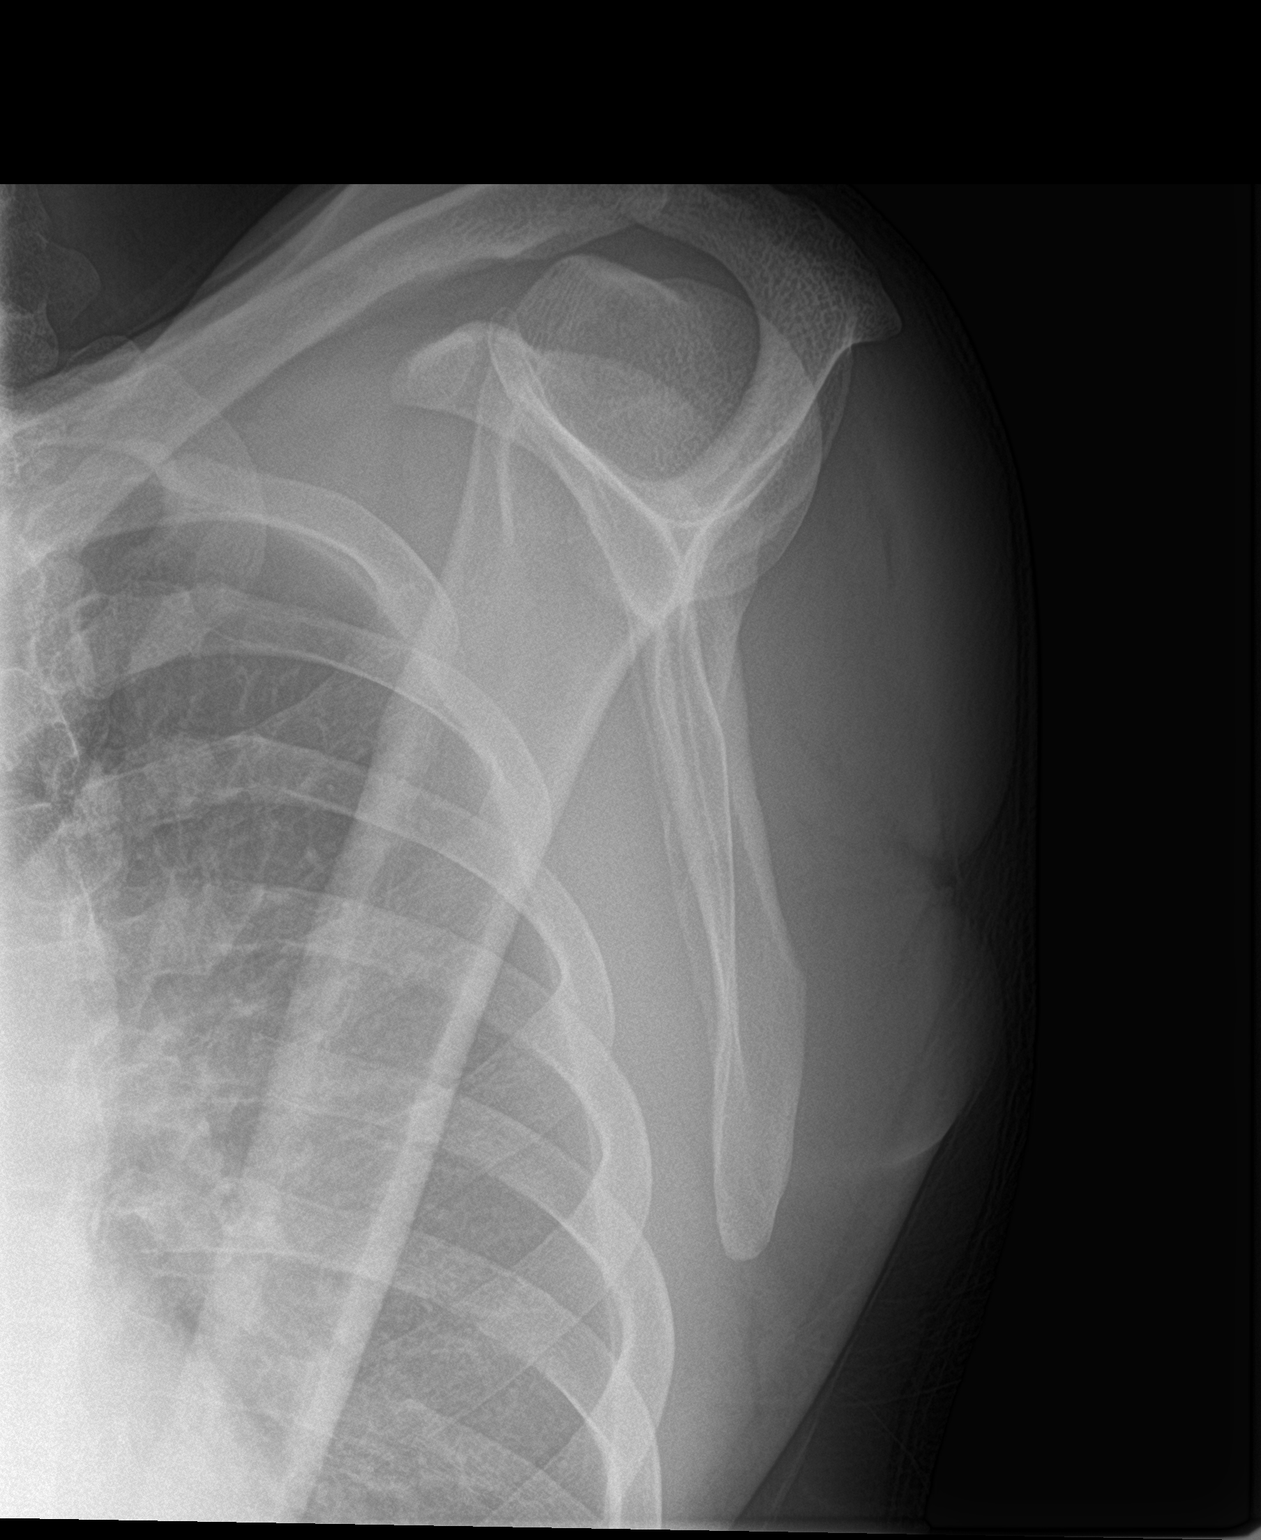

[shoulder axillary]
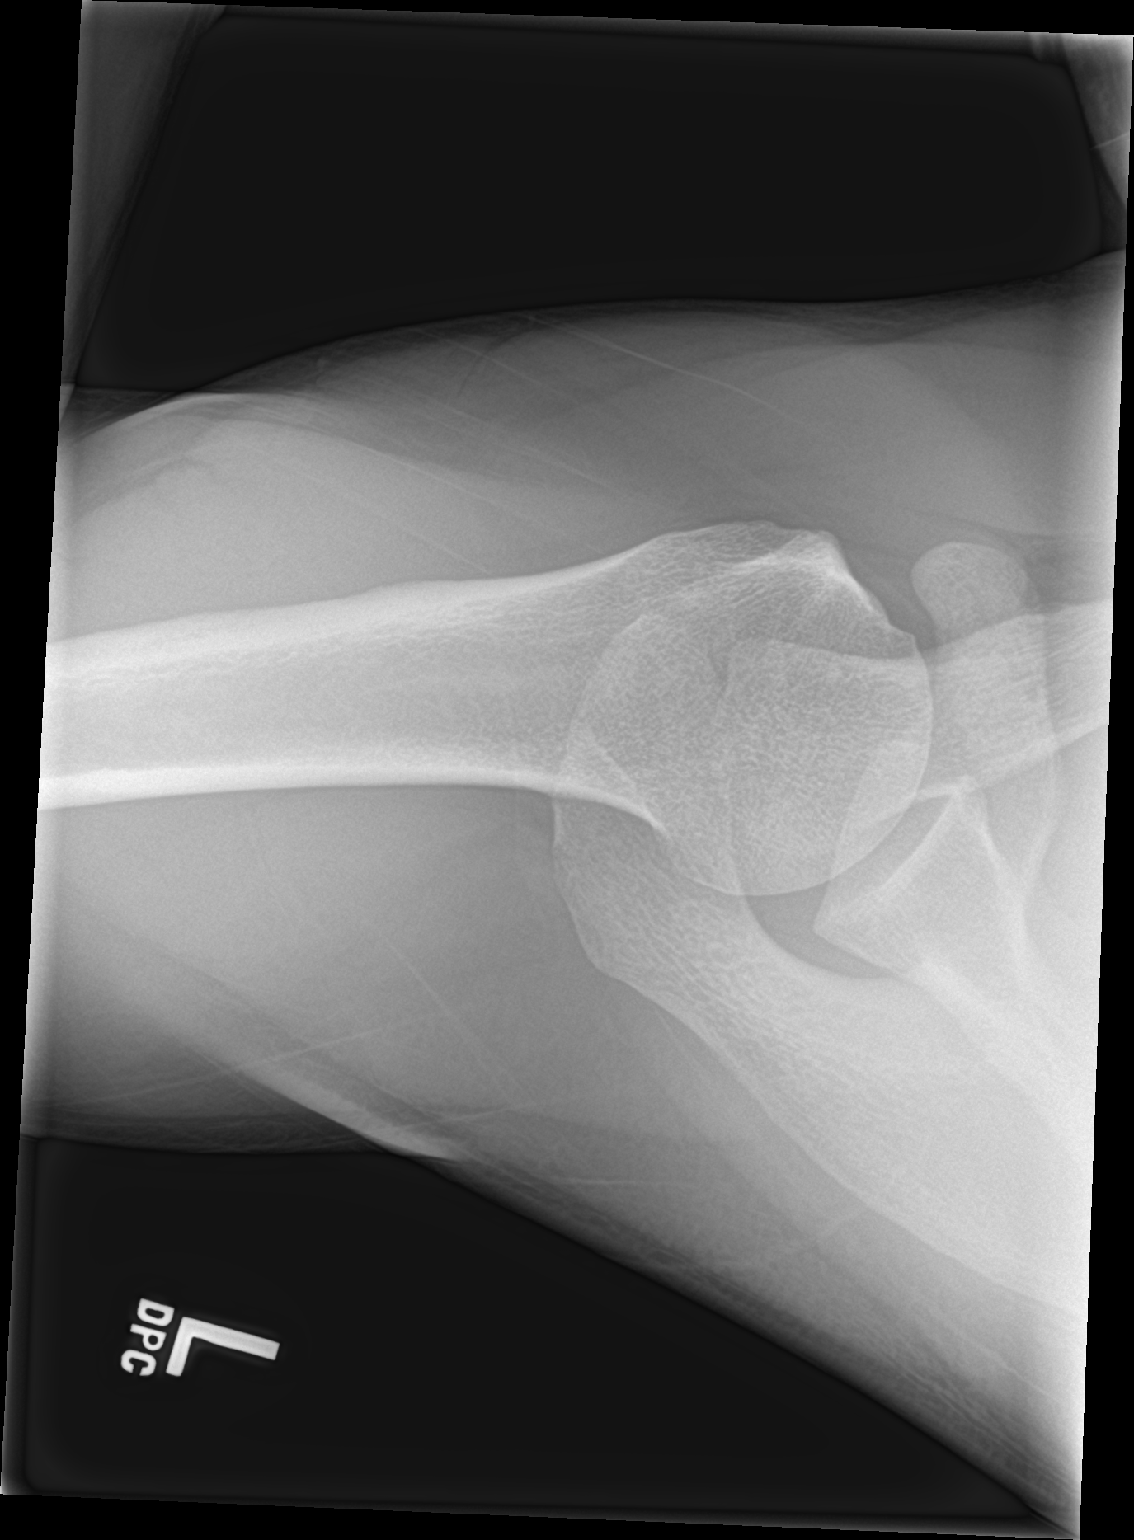

[3 of 3 positions shown; findings below may reference images not displayed]

FINDINGS: There is no evidence of fracture or dislocation. There is no
evidence of arthropathy or other focal bone abnormality. Soft
tissues are unremarkable.
IMPRESSION: Negative.
